# Patient Record
Sex: Male | Born: 2000 | Race: White | Hispanic: No | Marital: Single | Smoking: Never smoker
Health system: Southern US, Community
[De-identification: ages and names within clinical notes are randomized; demographics above are authoritative.]

## PROBLEM LIST (undated history)

## (undated) DIAGNOSIS — J45909 Unspecified asthma, uncomplicated: Secondary | ICD-10-CM

## (undated) HISTORY — DX: Unspecified asthma, uncomplicated: J45.909

---
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
## 2019-06-20 IMAGING — DX DG HAND COMPLETE 3+V*R*
3 series · 3 of 3 positions shown · non-contrast
Comparison: None.

CLINICAL DATA: Fourth and fifth fingers injured while wrestling

EXAM:
RIGHT HAND - COMPLETE 3+ VIEW

[hand pa]
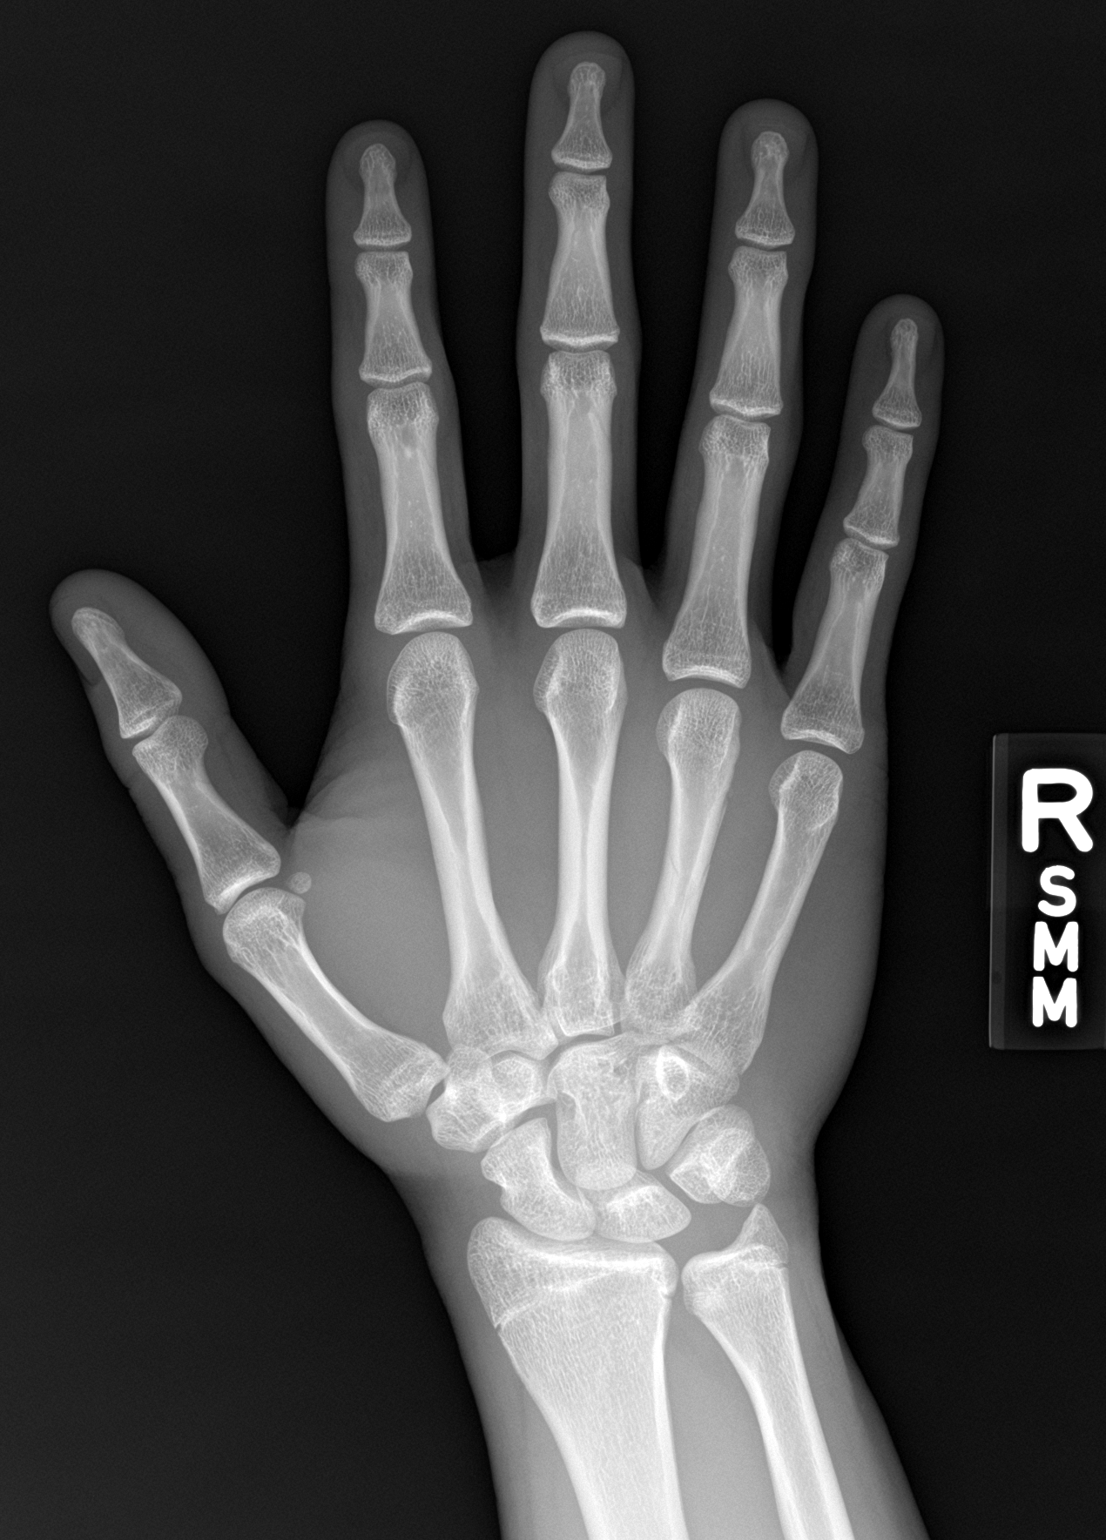

[hand obl]
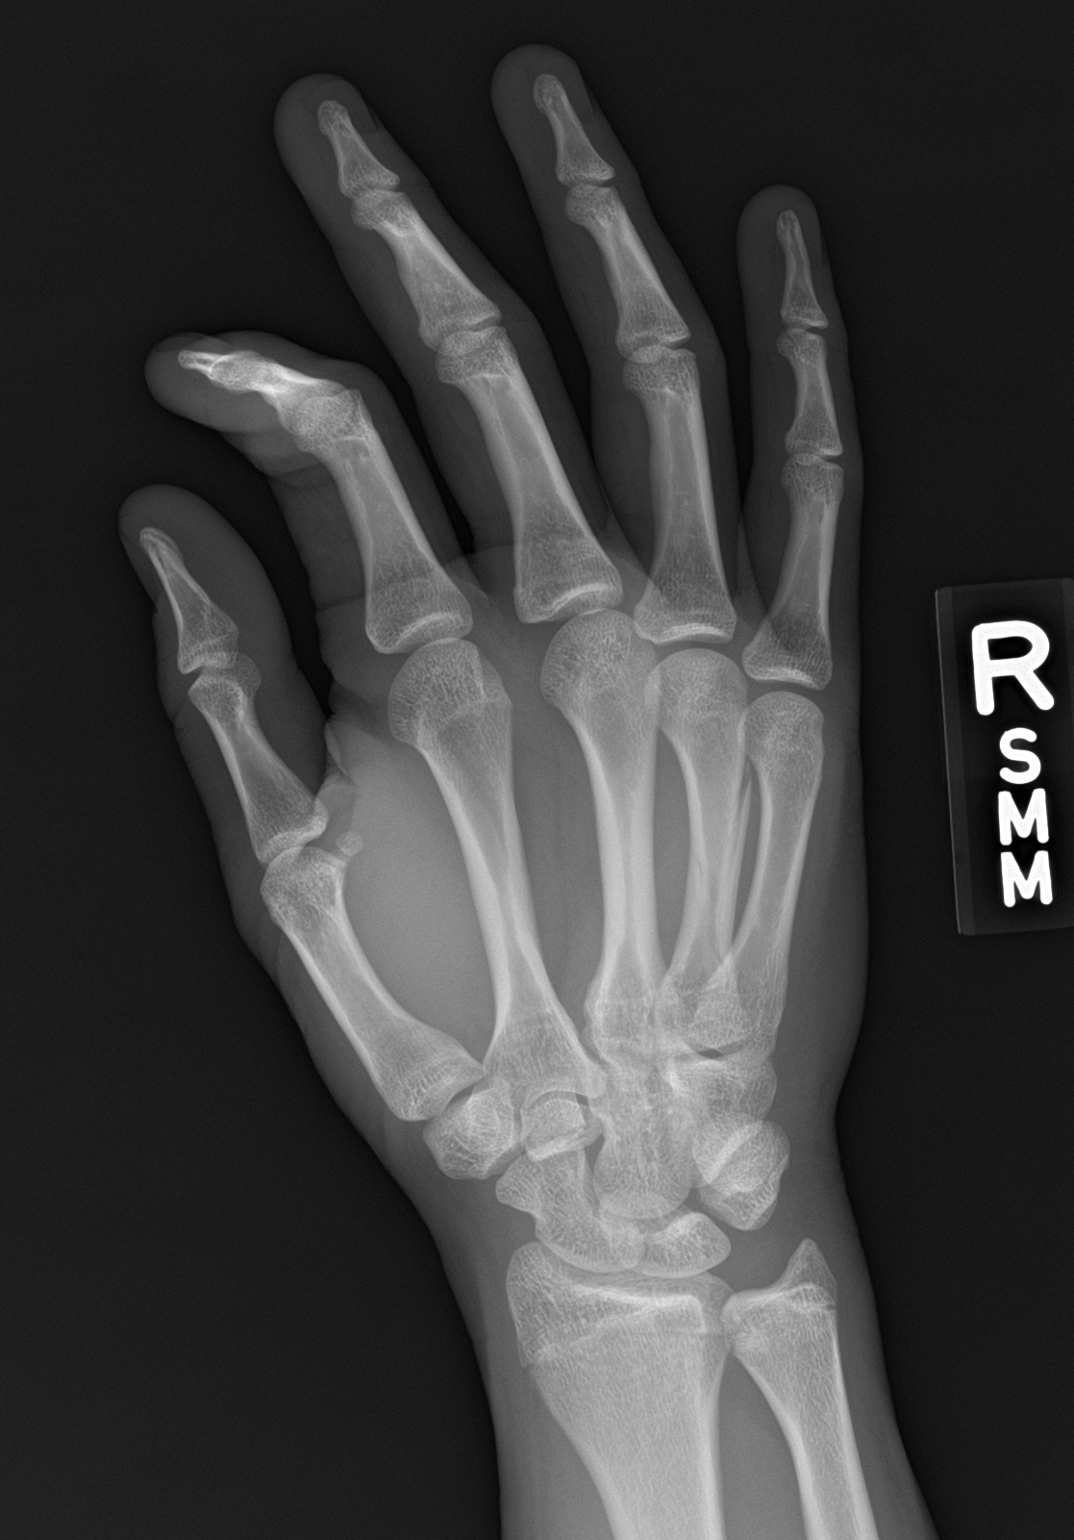

[hand lat]
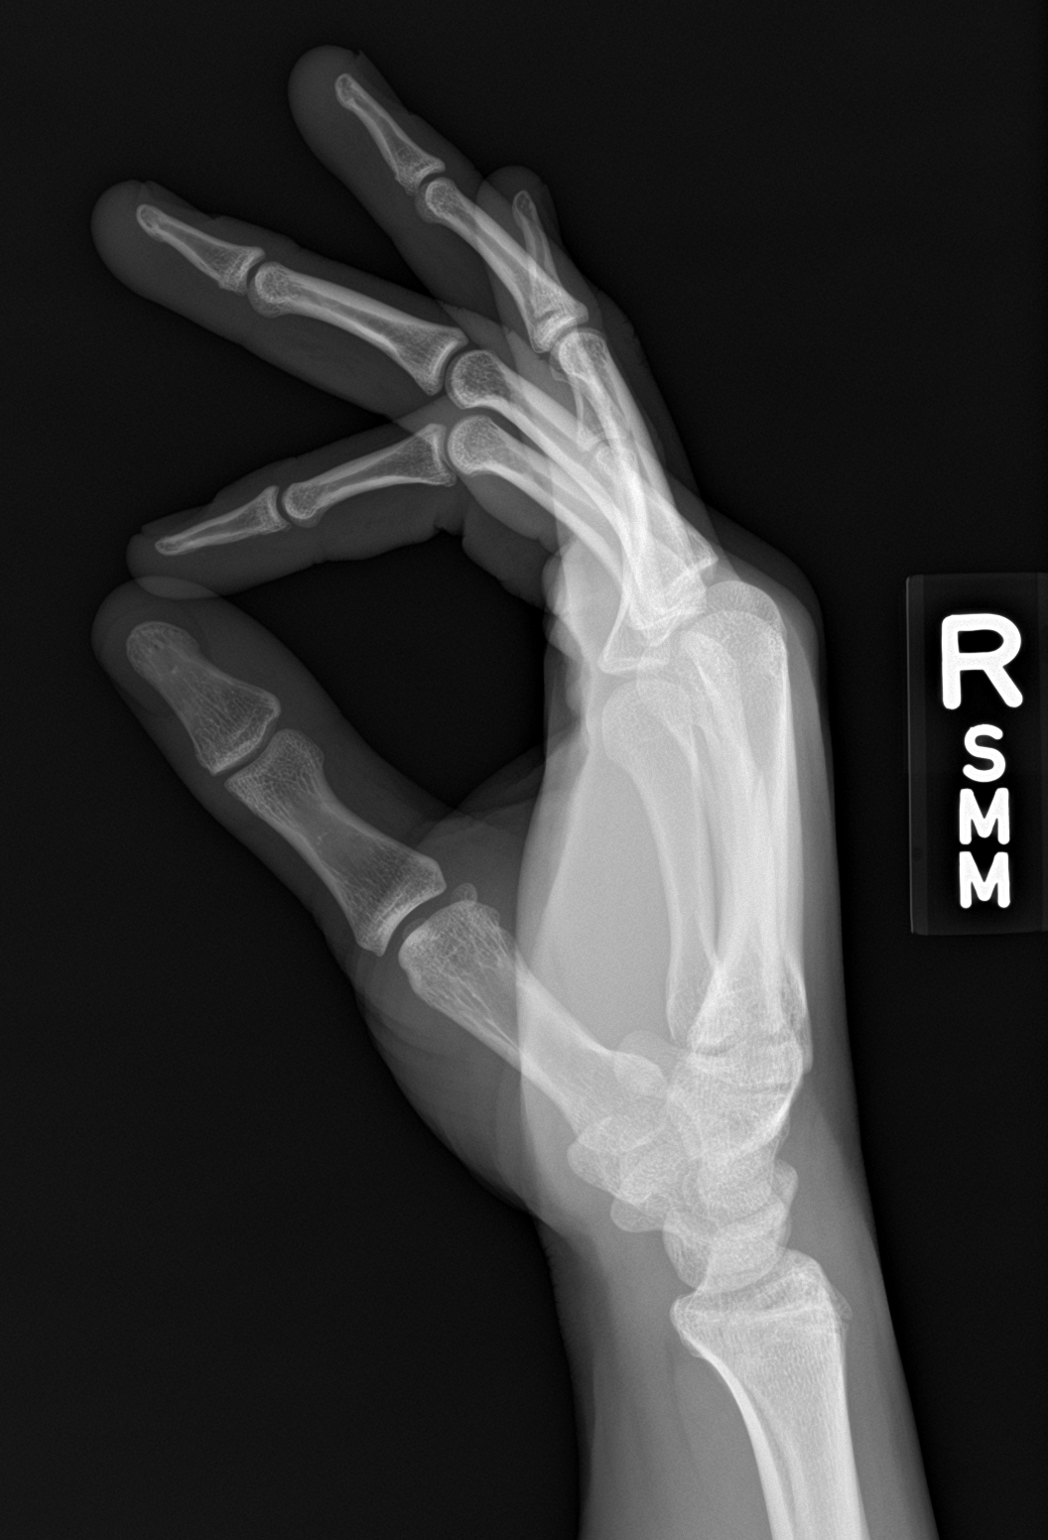

[3 of 3 positions shown; findings below may reference images not displayed]

FINDINGS: Oblique fracture of the fourth metacarpal extending from the
proximal shaft of the distal shaft, potentially with minimal
comminution. No other bony abnormality.
IMPRESSION: 1. Fourth metacarpal fracture extending from the proximal to the
distal shaft, potentially with mild comminution/dorsal intermediary
fragment.

## 2019-12-24 ENCOUNTER — Encounter (INDEPENDENT_AMBULATORY_CARE_PROVIDER_SITE_OTHER): Payer: Self-pay
